# Patient Record
Sex: Female | Born: 1970 | Race: Black or African American | Hispanic: No | Marital: Married | Smoking: Never smoker
Health system: Southern US, Community
[De-identification: ages and names within clinical notes are randomized; demographics above are authoritative.]

## PROBLEM LIST (undated history)

## (undated) DIAGNOSIS — O165 Unspecified maternal hypertension, complicating the puerperium: Secondary | ICD-10-CM

## (undated) HISTORY — PX: NO PAST SURGERIES: SHX2092

## (undated) HISTORY — PX: INCISION AND DRAINAGE BREAST ABSCESS: SUR672

## (undated) HISTORY — DX: Unspecified maternal hypertension, complicating the puerperium: O16.5

---

## 2009-08-07 ENCOUNTER — Other Ambulatory Visit: Admission: RE | Admit: 2009-08-07 | Discharge: 2009-08-07 | Payer: Self-pay | Admitting: Obstetrics and Gynecology

## 2010-04-04 ENCOUNTER — Encounter
Admission: RE | Admit: 2010-04-04 | Discharge: 2010-04-04 | Payer: Self-pay | Source: Home / Self Care | Attending: Obstetrics and Gynecology | Admitting: Obstetrics and Gynecology

## 2010-09-28 ENCOUNTER — Other Ambulatory Visit (HOSPITAL_COMMUNITY)
Admission: RE | Admit: 2010-09-28 | Discharge: 2010-09-28 | Disposition: A | Discharge status: Home / Self Care | Payer: BC Managed Care – PPO | Source: Ambulatory Visit | Attending: Obstetrics and Gynecology | Admitting: Obstetrics and Gynecology

## 2010-09-28 DIAGNOSIS — Z01419 Encounter for gynecological examination (general) (routine) without abnormal findings: Secondary | ICD-10-CM | POA: Insufficient documentation

## 2011-03-18 ENCOUNTER — Other Ambulatory Visit: Payer: Self-pay | Admitting: Obstetrics and Gynecology

## 2011-03-18 DIAGNOSIS — Z1231 Encounter for screening mammogram for malignant neoplasm of breast: Secondary | ICD-10-CM

## 2011-04-11 ENCOUNTER — Ambulatory Visit
Admission: RE | Admit: 2011-04-11 | Discharge: 2011-04-11 | Disposition: A | Discharge status: Home / Self Care | Payer: BC Managed Care – PPO | Source: Ambulatory Visit | Attending: Obstetrics and Gynecology | Admitting: Obstetrics and Gynecology

## 2011-04-11 DIAGNOSIS — Z1231 Encounter for screening mammogram for malignant neoplasm of breast: Secondary | ICD-10-CM

## 2011-04-19 ENCOUNTER — Other Ambulatory Visit: Payer: Self-pay | Admitting: Obstetrics and Gynecology

## 2011-04-19 DIAGNOSIS — R928 Other abnormal and inconclusive findings on diagnostic imaging of breast: Secondary | ICD-10-CM

## 2011-04-30 ENCOUNTER — Ambulatory Visit
Admission: RE | Admit: 2011-04-30 | Discharge: 2011-04-30 | Disposition: A | Discharge status: Home / Self Care | Payer: BC Managed Care – PPO | Source: Ambulatory Visit | Attending: Obstetrics and Gynecology | Admitting: Obstetrics and Gynecology

## 2011-04-30 DIAGNOSIS — R928 Other abnormal and inconclusive findings on diagnostic imaging of breast: Secondary | ICD-10-CM

## 2011-09-19 LAB — OB RESULTS CONSOLE HEPATITIS B SURFACE ANTIGEN: Hepatitis B Surface Ag: NEGATIVE

## 2011-09-19 LAB — OB RESULTS CONSOLE ABO/RH: RH Type: POSITIVE

## 2011-09-19 LAB — OB RESULTS CONSOLE RUBELLA ANTIBODY, IGM: Rubella: IMMUNE

## 2011-09-19 LAB — OB RESULTS CONSOLE RPR: RPR: NONREACTIVE

## 2011-09-19 LAB — OB RESULTS CONSOLE HIV ANTIBODY (ROUTINE TESTING): HIV: NONREACTIVE

## 2011-10-16 ENCOUNTER — Other Ambulatory Visit (HOSPITAL_COMMUNITY)
Admission: RE | Admit: 2011-10-16 | Discharge: 2011-10-16 | Disposition: A | Discharge status: Home / Self Care | Payer: BC Managed Care – PPO | Source: Ambulatory Visit | Attending: Obstetrics and Gynecology | Admitting: Obstetrics and Gynecology

## 2011-10-16 DIAGNOSIS — Z113 Encounter for screening for infections with a predominantly sexual mode of transmission: Secondary | ICD-10-CM | POA: Insufficient documentation

## 2011-10-16 DIAGNOSIS — Z01419 Encounter for gynecological examination (general) (routine) without abnormal findings: Secondary | ICD-10-CM | POA: Insufficient documentation

## 2012-04-07 ENCOUNTER — Other Ambulatory Visit (HOSPITAL_COMMUNITY): Payer: Self-pay | Admitting: Obstetrics and Gynecology

## 2012-04-07 DIAGNOSIS — O9989 Other specified diseases and conditions complicating pregnancy, childbirth and the puerperium: Secondary | ICD-10-CM

## 2012-04-09 ENCOUNTER — Ambulatory Visit (HOSPITAL_COMMUNITY)
Admission: RE | Admit: 2012-04-09 | Discharge: 2012-04-09 | Disposition: A | Discharge status: Home / Self Care | Payer: BC Managed Care – PPO | Source: Ambulatory Visit | Attending: Obstetrics and Gynecology | Admitting: Obstetrics and Gynecology

## 2012-04-09 DIAGNOSIS — O9989 Other specified diseases and conditions complicating pregnancy, childbirth and the puerperium: Secondary | ICD-10-CM

## 2012-04-09 DIAGNOSIS — O09529 Supervision of elderly multigravida, unspecified trimester: Secondary | ICD-10-CM | POA: Insufficient documentation

## 2012-04-09 DIAGNOSIS — O3660X0 Maternal care for excessive fetal growth, unspecified trimester, not applicable or unspecified: Secondary | ICD-10-CM | POA: Insufficient documentation

## 2012-04-28 ENCOUNTER — Encounter (HOSPITAL_COMMUNITY): Payer: Self-pay | Admitting: *Deleted

## 2012-04-28 ENCOUNTER — Inpatient Hospital Stay (HOSPITAL_COMMUNITY)
Admission: AD | Admit: 2012-04-28 | Discharge: 2012-04-30 | DRG: 372 | Disposition: A | Discharge status: Home / Self Care | Payer: BC Managed Care – PPO | Source: Ambulatory Visit | Attending: Obstetrics and Gynecology | Admitting: Obstetrics and Gynecology

## 2012-04-28 ENCOUNTER — Encounter (HOSPITAL_COMMUNITY): Payer: Self-pay | Admitting: Anesthesiology

## 2012-04-28 ENCOUNTER — Inpatient Hospital Stay (HOSPITAL_COMMUNITY): Payer: BC Managed Care – PPO | Admitting: Anesthesiology

## 2012-04-28 DIAGNOSIS — O139 Gestational [pregnancy-induced] hypertension without significant proteinuria, unspecified trimester: Secondary | ICD-10-CM | POA: Diagnosis present

## 2012-04-28 DIAGNOSIS — O09529 Supervision of elderly multigravida, unspecified trimester: Secondary | ICD-10-CM | POA: Diagnosis present

## 2012-04-28 DIAGNOSIS — O99892 Other specified diseases and conditions complicating childbirth: Secondary | ICD-10-CM | POA: Diagnosis present

## 2012-04-28 DIAGNOSIS — Z22330 Carrier of Group B streptococcus: Secondary | ICD-10-CM

## 2012-04-28 LAB — CBC
HCT: 33.4 % — ABNORMAL LOW (ref 36.0–46.0)
HCT: 29.9 % — ABNORMAL LOW (ref 36.0–46.0)
Hemoglobin: 10 g/dL — ABNORMAL LOW (ref 12.0–15.0)
MCV: 84.3 fL (ref 78.0–100.0)
RBC: 3.96 MIL/uL (ref 3.87–5.11)
RBC: 3.54 MIL/uL — ABNORMAL LOW (ref 3.87–5.11)
RDW: 15.3 % (ref 11.5–15.5)
WBC: 8.1 10*3/uL (ref 4.0–10.5)
WBC: 13 10*3/uL — ABNORMAL HIGH (ref 4.0–10.5)

## 2012-04-28 LAB — TYPE AND SCREEN

## 2012-04-28 LAB — CREATININE, SERUM
GFR calc Af Amer: >90 mL/min (ref >90)
GFR calc non Af Amer: >90 mL/min (ref >90)

## 2012-04-28 LAB — ALT: ALT: 18 U/L (ref 0–35)

## 2012-04-28 LAB — URIC ACID: Uric Acid, Serum: 5 mg/dL (ref 2.4–7.0)

## 2012-04-28 LAB — AST: AST: 23 U/L (ref 0–37)

## 2012-04-28 MED ORDER — IBUPROFEN 600 MG PO TABS
600.0000 mg | ORAL_TABLET | Freq: Four times a day (QID) | ORAL | Status: DC
  Administered 2012-04-28 – 2012-04-30 (×9): 600 mg via ORAL
  Filled 2012-04-28 (×9): qty 1

## 2012-04-28 MED ORDER — ONDANSETRON HCL 4 MG PO TABS: 4.0000 mg | ORAL_TABLET | ORAL | Status: DC | PRN

## 2012-04-28 MED ORDER — EPHEDRINE 5 MG/ML INJ: 10.0000 mg | INTRAVENOUS | Status: DC | PRN

## 2012-04-28 MED ORDER — LACTATED RINGERS IV SOLN: 500.0000 mL | Freq: Once | INTRAVENOUS | Status: DC

## 2012-04-28 MED ORDER — FENTANYL 2.5 MCG/ML BUPIVACAINE 1/10 % EPIDURAL INFUSION (WH - ANES)
INTRAMUSCULAR | Status: DC | PRN
  Administered 2012-04-28: 14 mL/h via EPIDURAL

## 2012-04-28 MED ORDER — BENZOCAINE-MENTHOL 20-0.5 % EX AERO
1.0000 "application " | INHALATION_SPRAY | CUTANEOUS | Status: DC | PRN
  Administered 2012-04-30: 1 via TOPICAL
  Filled 2012-04-28 (×2): qty 56

## 2012-04-28 MED ORDER — DIPHENHYDRAMINE HCL 50 MG/ML IJ SOLN: 12.5000 mg | INTRAMUSCULAR | Status: DC | PRN

## 2012-04-28 MED ORDER — CITRIC ACID-SODIUM CITRATE 334-500 MG/5ML PO SOLN: 30.0000 mL | ORAL | Status: DC | PRN

## 2012-04-28 MED ORDER — DIBUCAINE 1 % RE OINT: 1.0000 "application " | TOPICAL_OINTMENT | RECTAL | Status: DC | PRN

## 2012-04-28 MED ORDER — DIPHENHYDRAMINE HCL 25 MG PO CAPS: 25.0000 mg | ORAL_CAPSULE | Freq: Four times a day (QID) | ORAL | Status: DC | PRN

## 2012-04-28 MED ORDER — PENICILLIN G POTASSIUM 5000000 UNITS IJ SOLR
5.0000 10*6.[IU] | Freq: Once | INTRAVENOUS | Status: AC
  Administered 2012-04-28: 5 10*6.[IU] via INTRAVENOUS
  Filled 2012-04-28: qty 5

## 2012-04-28 MED ORDER — WITCH HAZEL-GLYCERIN EX PADS: 1.0000 "application " | MEDICATED_PAD | CUTANEOUS | Status: DC | PRN

## 2012-04-28 MED ORDER — MEDROXYPROGESTERONE ACETATE 150 MG/ML IM SUSP: 150.0000 mg | INTRAMUSCULAR | Status: DC | PRN

## 2012-04-28 MED ORDER — ZOLPIDEM TARTRATE 5 MG PO TABS: 5.0000 mg | ORAL_TABLET | Freq: Every evening | ORAL | Status: DC | PRN

## 2012-04-28 MED ORDER — FENTANYL 2.5 MCG/ML BUPIVACAINE 1/10 % EPIDURAL INFUSION (WH - ANES)
14.0000 mL/h | INTRAMUSCULAR | Status: DC
  Filled 2012-04-28: qty 125

## 2012-04-28 MED ORDER — IBUPROFEN 600 MG PO TABS: 600.0000 mg | ORAL_TABLET | Freq: Four times a day (QID) | ORAL | Status: DC | PRN

## 2012-04-28 MED ORDER — SENNOSIDES-DOCUSATE SODIUM 8.6-50 MG PO TABS
2.0000 | ORAL_TABLET | Freq: Every day | ORAL | Status: DC
  Administered 2012-04-28 – 2012-04-29 (×2): 2 via ORAL

## 2012-04-28 MED ORDER — PHENYLEPHRINE 40 MCG/ML (10ML) SYRINGE FOR IV PUSH (FOR BLOOD PRESSURE SUPPORT): 80.0000 ug | PREFILLED_SYRINGE | INTRAVENOUS | Status: DC | PRN

## 2012-04-28 MED ORDER — LACTATED RINGERS IV SOLN
INTRAVENOUS | Status: DC
  Administered 2012-04-28: 01:00:00 via INTRAVENOUS

## 2012-04-28 MED ORDER — TETANUS-DIPHTH-ACELL PERTUSSIS 5-2.5-18.5 LF-MCG/0.5 IM SUSP: 0.5000 mL | Freq: Once | INTRAMUSCULAR | Status: DC

## 2012-04-28 MED ORDER — PENICILLIN G POTASSIUM 5000000 UNITS IJ SOLR
2.5000 10*6.[IU] | INTRAVENOUS | Status: DC
  Filled 2012-04-28 (×4): qty 2.5

## 2012-04-28 MED ORDER — PRENATAL MULTIVITAMIN CH
1.0000 | ORAL_TABLET | Freq: Every day | ORAL | Status: DC
  Administered 2012-04-28 – 2012-04-29 (×2): 1 via ORAL
  Filled 2012-04-28 (×3): qty 1

## 2012-04-28 MED ORDER — OXYTOCIN 40 UNITS IN LACTATED RINGERS INFUSION - SIMPLE MED
62.5000 mL/h | INTRAVENOUS | Status: DC
  Administered 2012-04-28: 62.5 mL/h via INTRAVENOUS
  Filled 2012-04-28: qty 1000

## 2012-04-28 MED ORDER — OXYTOCIN BOLUS FROM INFUSION
500.0000 mL | INTRAVENOUS | Status: DC
  Administered 2012-04-28: 500 mL via INTRAVENOUS

## 2012-04-28 MED ORDER — MEASLES, MUMPS & RUBELLA VAC ~~LOC~~ INJ
0.5000 mL | INJECTION | Freq: Once | SUBCUTANEOUS | Status: DC
  Filled 2012-04-28: qty 0.5

## 2012-04-28 MED ORDER — SIMETHICONE 80 MG PO CHEW: 80.0000 mg | CHEWABLE_TABLET | ORAL | Status: DC | PRN

## 2012-04-28 MED ORDER — PHENYLEPHRINE 40 MCG/ML (10ML) SYRINGE FOR IV PUSH (FOR BLOOD PRESSURE SUPPORT)
80.0000 ug | PREFILLED_SYRINGE | INTRAVENOUS | Status: DC | PRN
  Filled 2012-04-28: qty 5

## 2012-04-28 MED ORDER — LIDOCAINE HCL (PF) 1 % IJ SOLN
30.0000 mL | INTRAMUSCULAR | Status: DC | PRN
  Filled 2012-04-28: qty 30

## 2012-04-28 MED ORDER — LACTATED RINGERS IV SOLN: 500.0000 mL | INTRAVENOUS | Status: DC | PRN

## 2012-04-28 MED ORDER — LANOLIN HYDROUS EX OINT: TOPICAL_OINTMENT | CUTANEOUS | Status: DC | PRN

## 2012-04-28 MED ORDER — ONDANSETRON HCL 4 MG/2ML IJ SOLN: 4.0000 mg | Freq: Four times a day (QID) | INTRAMUSCULAR | Status: DC | PRN

## 2012-04-28 MED ORDER — OXYCODONE-ACETAMINOPHEN 5-325 MG PO TABS: 1.0000 | ORAL_TABLET | ORAL | Status: DC | PRN

## 2012-04-28 MED ORDER — ACETAMINOPHEN 325 MG PO TABS: 650.0000 mg | ORAL_TABLET | ORAL | Status: DC | PRN

## 2012-04-28 MED ORDER — ONDANSETRON HCL 4 MG/2ML IJ SOLN: 4.0000 mg | INTRAMUSCULAR | Status: DC | PRN

## 2012-04-28 MED ORDER — LIDOCAINE HCL (PF) 1 % IJ SOLN
INTRAMUSCULAR | Status: DC | PRN
  Administered 2012-04-28 (×2): 4 mL

## 2012-04-28 MED ORDER — EPHEDRINE 5 MG/ML INJ
10.0000 mg | INTRAVENOUS | Status: DC | PRN
  Filled 2012-04-28: qty 4

## 2012-04-28 NOTE — Anesthesia Preprocedure Evaluation (Signed)

## 2012-04-28 NOTE — Anesthesia Postprocedure Evaluation (Signed)
  Anesthesia Post-op Note  Patient: Dawn Solis  Procedure(s) Performed: * No procedures listed *  Patient Location: Mother/Baby  Anesthesia Type:Epidural  Level of Consciousness: awake, alert  and oriented  Airway and Oxygen Therapy: Patient Spontanous Breathing  Post-op Pain: mild  Post-op Assessment: Patient's Cardiovascular Status Stable, Respiratory Function Stable, No headache, No backache, No residual numbness and No residual motor weakness  Post-op Vital Signs: stable  Complications: No apparent anesthesia complications

## 2012-04-28 NOTE — Anesthesia Procedure Notes (Signed)
Epidural Patient location during procedure: OB Start time: 04/28/2012 2:24 AM  Staffing Anesthesiologist: Adaiah Morken A. Performed by: anesthesiologist   Preanesthetic Checklist Completed: patient identified, site marked, surgical consent, pre-op evaluation, timeout performed, IV checked, risks and benefits discussed and monitors and equipment checked  Epidural Patient position: sitting Prep: site prepped and draped and DuraPrep Patient monitoring: continuous pulse ox and blood pressure Approach: midline Injection technique: LOR air  Needle:  Needle type: Tuohy  Needle gauge: 17 G Needle length: 9 cm and 9 Needle insertion depth: 6 cm Catheter type: closed end flexible Catheter size: 19 Gauge Catheter at skin depth: 11 cm Test dose: negative and Other  Assessment Events: blood not aspirated, injection not painful, no injection resistance, negative IV test and no paresthesia  Additional Notes Patient identified. Risks and benefits discussed including failed block, incomplete  Pain control, post dural puncture headache, nerve damage, paralysis, blood pressure Changes, nausea, vomiting, reactions to medications-both toxic and allergic and post Partum back pain. All questions were answered. Patient expressed understanding and wished to proceed. Sterile technique was used throughout procedure. Epidural site was Dressed with sterile barrier dressing. No paresthesias, signs of intravascular injection Or signs of intrathecal spread were encountered.  Patient was more comfortable after the epidural was dosed. Please see RN's note for documentation of vital signs and FHR which are stable.

## 2012-04-28 NOTE — Progress Notes (Signed)
Post Partum Day 0 s/p vaginal delivery.  Subjective: no complaints, up ad lib, voiding and tolerating PO she denies headache visual disturbances or ruq pain  Objective: Blood pressure 141/78, pulse 99, temperature 97.8 F (36.6 C), temperature source Oral, resp. rate 18, height 5\' 6"  (1.676 m), weight 98.068 kg (216 lb 3.2 oz), SpO2 97.00%, unknown if currently breastfeeding.  Physical Exam:  General: alert and cooperative Lochia: appropriate Uterine Fundus: firm Incision: NA DVT Evaluation: No evidence of DVT seen on physical exam.   Basename 04/28/12 0110  HGB 11.0*  HCT 33.4*    Assessment/Plan: Circumcision prior to discharge' BP is mildly elevated will check HELLP labs .Marland Kitchen No signs or symptoms of preeclampsia on exam    LOS: 0 days   Lorrie Gargan J. 04/28/2012, 12:03 PM

## 2012-04-28 NOTE — H&P (Addendum)
Dawn Solis is a 42 y.o. female [redacted] weeks gestation, presenting for labor.  Denies rupture of membranes.   OB History    Grav Para Term Preterm Abortions TAB SAB Ect Mult Living   3 2 2       2      Past Medical History  Diagnosis Date  . No pertinent past medical history    Past Surgical History  Procedure Date  . No past surgeries    Family History: family history includes Arthritis in her mother; Cancer in her father; Diabetes in her father, paternal grandfather, and paternal grandmother; and Hypertension in her father, maternal grandfather, maternal grandmother, and mother. Social History:  reports that she has never smoked. She does not have any smokeless tobacco history on file. She reports that she does not drink alcohol or use illicit drugs. ROS noncontributory  Physical exam: General:  Well developed female.  No distress. HEENT normal Chest clear Heart S1 and S2 clear Abd term gravida Ext- nl Cx 4-5, C, -2 on admission Blood pressure 135/74, pulse 100, temperature 97.8 F (36.6 C), temperature source Oral, resp. rate 18, height 5\' 6"  (1.676 m), weight 98.068 kg (216 lb 3.2 oz).  ABO, Rh: O/--/-- (06/13 0000) Antibody: Negative (06/06 0000) Rubella:   RPR: Nonreactive (06/06 0000)  HBsAg: Negative (06/06 0000)  HIV: Non-reactive (06/06 0000)  GBS: Positive (12/18 0000)   Assessment/Plan: Term labor  Expectant care   Dawn Solis E 04/28/2012, 3:11 AM

## 2012-04-28 NOTE — Progress Notes (Signed)
Delivery Note At 3:29 AM a viable female was delivered via Vaginal, Spontaneous Delivery (Presentation: ; Occiput Anterior).  APGAR: , ; weight .   Placenta status: Intact, Spontaneous.  Cord:  with the following complications: None.   Anesthesia: Epidural  Episiotomy: None Lacerations: 1st degree Suture Repair: 2.0 chromic Est. Blood Loss (mL):   Mom to postpartum.  Baby to nursery-stable.  Charly Hunton E 04/28/2012, 3:46 AM

## 2012-04-28 NOTE — MAU Note (Signed)
Pt G3 P2 at 40.1wks having contractions every 3-31min.  GBS +.

## 2012-04-29 LAB — CBC
MCH: 28.2 pg (ref 26.0–34.0)
MCHC: 33 g/dL (ref 30.0–36.0)
MCV: 85.5 fL (ref 78.0–100.0)
Platelets: 201 10*3/uL (ref 150–400)

## 2012-04-29 NOTE — Progress Notes (Signed)
Post Partum Day 1 s/p vaginal delivery  Subjective: up ad lib, voiding and tolerating PO she denies headache blurred vision or ruq pain   Objective: Blood pressure 145/88, pulse 84, temperature 98 F (36.7 C), temperature source Oral, resp. rate 20, height 5\' 6"  (1.676 m), weight 98.068 kg (216 lb 3.2 oz), SpO2 96.00%, unknown if currently breastfeeding.  Physical Exam:  General: alert and cooperative Lochia: appropriate Uterine Fundus: firm Incision: NA DVT Evaluation: No evidence of DVT seen on physical exam.   Basename 04/29/12 0501 04/28/12 1340  HGB 9.5* 10.0*  HCT 28.8* 29.9*    Assessment/Plan: Plan for discharge tomorrow, Breastfeeding, Circumcision prior to discharge and Contraception will discuss further at postpartum visit  Pt with gestational hypertension.. Plan to have her come to the office next week for bp check.    LOS: 1 day   Dawn Solis J. 04/29/2012, 7:26 PM

## 2012-04-30 MED ORDER — IBUPROFEN 600 MG PO TABS: 600.0000 mg | ORAL_TABLET | Freq: Four times a day (QID) | ORAL | Status: DC

## 2012-04-30 MED ORDER — OXYCODONE-ACETAMINOPHEN 5-325 MG PO TABS: 1.0000 | ORAL_TABLET | ORAL | Status: DC | PRN

## 2012-04-30 NOTE — Progress Notes (Signed)
Post Partum Day 2 Subjective: no complaints  Denies headaches, visual changes.  Bleeding not excessive.  Objective: Blood pressure 130/75, pulse 73, temperature 98 F (36.7 C), temperature source Oral, resp. rate 18, height 5\' 6"  (1.676 m), weight 98.068 kg (216 lb 3.2 oz), SpO2 96.00%, unknown if currently breastfeeding.  Physical Exam:  General: alert, cooperative and no distress Lochia: not assessed at perineum Uterine Fundus: firm Incision: n/a DVT Evaluation: No evidence of DVT seen on physical exam.   Basename 04/29/12 0501 04/28/12 1340  HGB 9.5* 10.0*  HCT 28.8* 29.9*    Assessment/Plan: Discharge home GHTN.  F/u in 1 week.  Preeclampsia precautions given. PP depression precautions reviewed.   LOS: 2 days   Dawn Solis 04/30/2012, 8:22 AM

## 2012-04-30 NOTE — Discharge Summary (Signed)
Obstetric Discharge Summary Reason for Admission: onset of labor Prenatal Procedures: none Intrapartum Procedures: spontaneous vaginal delivery Postpartum Procedures: none Complications-Operative and Postpartum: 1st degree perineal laceration Hemoglobin  Date Value Range Status  04/29/2012 9.5* 12.0 - 15.0 g/dL Final     HCT  Date Value Range Status  04/29/2012 28.8* 36.0 - 46.0 % Final    Physical Exam:  Wnl.  See progress note day of discharge.  Discharge Diagnoses: Term Pregnancy-delivered  Discharge Information: Date: 04/30/2012 Activity: pelvic rest Diet: routine Medications: PNV, Ibuprofen, Iron and Percocet Condition: stable Instructions: See discharge instructions. Discharge to: home Follow-up Information    Follow up with Jessee Avers., MD. Schedule an appointment as soon as possible for a visit in 1 week. (BP check)    Contact information:   [address] [place] [address] [phone]       Follow up with Jessee Avers., MD. Schedule an appointment as soon as possible for a visit in 6 weeks. (Postpartum follow up.)    Contact information:   [address] [place] [address] [phone]          Newborn Data: Live born female  Birth Weight: 6 lb 8.4 oz (2960 g) APGAR: 9, 9  Home with mother.  Circ done by Dr. Richardson Dopp PP day #1.  Geryl Rankins 04/30/2012, 8:27 AM

## 2012-05-04 ENCOUNTER — Inpatient Hospital Stay (HOSPITAL_COMMUNITY): Admission: RE | Admit: 2012-05-04 | Payer: BC Managed Care – PPO | Source: Ambulatory Visit

## 2012-07-02 ENCOUNTER — Other Ambulatory Visit: Payer: Self-pay | Admitting: Obstetrics and Gynecology

## 2012-07-06 ENCOUNTER — Other Ambulatory Visit: Payer: Self-pay | Admitting: Obstetrics and Gynecology

## 2012-07-06 ENCOUNTER — Ambulatory Visit
Admission: RE | Admit: 2012-07-06 | Discharge: 2012-07-06 | Disposition: A | Discharge status: Home / Self Care | Payer: BC Managed Care – PPO | Source: Ambulatory Visit | Attending: Obstetrics and Gynecology | Admitting: Obstetrics and Gynecology

## 2012-07-06 DIAGNOSIS — N6310 Unspecified lump in the right breast, unspecified quadrant: Secondary | ICD-10-CM

## 2012-07-08 ENCOUNTER — Encounter (INDEPENDENT_AMBULATORY_CARE_PROVIDER_SITE_OTHER): Payer: BC Managed Care – PPO | Admitting: General Surgery

## 2012-07-09 ENCOUNTER — Encounter (INDEPENDENT_AMBULATORY_CARE_PROVIDER_SITE_OTHER): Payer: Self-pay | Admitting: General Surgery

## 2012-07-09 ENCOUNTER — Other Ambulatory Visit (INDEPENDENT_AMBULATORY_CARE_PROVIDER_SITE_OTHER): Payer: Self-pay | Admitting: General Surgery

## 2012-07-09 ENCOUNTER — Ambulatory Visit (INDEPENDENT_AMBULATORY_CARE_PROVIDER_SITE_OTHER): Payer: BC Managed Care – PPO | Admitting: General Surgery

## 2012-07-09 VITALS — BP 120/80 | HR 84 | Temp 98.6°F | Resp 16 | Ht 66.0 in | Wt 181.0 lb

## 2012-07-09 DIAGNOSIS — N611 Abscess of the breast and nipple: Secondary | ICD-10-CM | POA: Insufficient documentation

## 2012-07-09 DIAGNOSIS — N61 Mastitis without abscess: Secondary | ICD-10-CM

## 2012-07-09 NOTE — Patient Instructions (Signed)
Remove dressing tomorrow and shower then repack wound with gauze

## 2012-07-09 NOTE — Progress Notes (Signed)
Subjective:     Patient ID: Dawn Solis, female   DOB: Feb 14, 1971, 42 y.o.   MRN: [id number]  HPI We are asked to see the patient in consultation by Dr. Richardson Dopp to evaluate her for a right breast abscess. The patient is a 42 year old black female who recently had a new baby couple months ago. In February she started developing some swelling in the upper right breast. She had an ultrasound-guided aspiration of this fluid 3 days ago. She also just completed 10 days of Augmentin. Since stopping the Augmentin she has not had any fevers or chills. Her right breast pain has continued to improve.  Review of Systems  Constitutional: Negative.   HENT: Negative.   Eyes: Negative.   Respiratory: Negative.   Cardiovascular: Negative.   Gastrointestinal: Negative.   Endocrine: Negative.   Genitourinary: Negative.   Musculoskeletal: Negative.   Skin: Negative.   Allergic/Immunologic: Negative.   Neurological: Negative.   Hematological: Negative.   Psychiatric/Behavioral: Negative.        Objective:   Physical Exam  Constitutional: She is oriented to person, place, and time. She appears well-developed and well-nourished.  HENT:  Head: Normocephalic and atraumatic.  Eyes: Conjunctivae and EOM are normal. Pupils are equal, round, and reactive to light.  Neck: Normal range of motion. Neck supple.  Cardiovascular: Normal rate, regular rhythm and normal heart sounds.   Pulmonary/Chest: Effort normal and breath sounds normal.  There is a fluctuant area about 2-3 cm across in the 12:00 position of the right breast. There is some surrounding induration but no cellulitis.  Abdominal: Soft. Bowel sounds are normal.  Musculoskeletal: Normal range of motion.  Neurological: She is alert and oriented to person, place, and time.  Skin: Skin is warm and dry.  Psychiatric: She has a normal mood and affect. Her behavior is normal.       Assessment:     The patient appears to have a right breast abscess.      Plan:     The area of the right breast was prepped with ChloraPrep and infiltrated with 1% lidocaine with epinephrine. A small transverse incision was made overlying the fluctuant area with an 11 blade knife. We drained a moderate amount of old bloody fluid. There was no purulent material there. The wound was then packed with a 4 x 4 gauze and sterile dressings were applied. The patient tolerated the procedure well. We will plan to see her back in one week to check her progress. She was given instructions on how to do dressing changes.

## 2012-07-10 ENCOUNTER — Telehealth (INDEPENDENT_AMBULATORY_CARE_PROVIDER_SITE_OTHER): Payer: Self-pay | Admitting: General Surgery

## 2012-07-10 NOTE — Telephone Encounter (Signed)
Lorene Dy norco protocol. Please call in for patient.

## 2012-07-10 NOTE — Telephone Encounter (Signed)
Called in Rx Vicodin at the [facility] on [address] [phone] and spoke to [place] and patient is aware of the Rx being called in

## 2012-07-10 NOTE — Telephone Encounter (Signed)
Patient called and wants something stronger for pain, she was seen in the urgent office on 07-09-12 with breast abscess. Patient can be called back at home if there is any questions

## 2012-07-12 LAB — WOUND CULTURE: Gram Stain: NONE SEEN

## 2012-07-14 ENCOUNTER — Ambulatory Visit (INDEPENDENT_AMBULATORY_CARE_PROVIDER_SITE_OTHER): Payer: BC Managed Care – PPO | Admitting: Surgery

## 2012-07-14 ENCOUNTER — Encounter (INDEPENDENT_AMBULATORY_CARE_PROVIDER_SITE_OTHER): Payer: Self-pay | Admitting: Surgery

## 2012-07-14 ENCOUNTER — Telehealth (INDEPENDENT_AMBULATORY_CARE_PROVIDER_SITE_OTHER): Payer: Self-pay | Admitting: *Deleted

## 2012-07-14 VITALS — BP 132/82 | HR 83 | Temp 98.2°F | Resp 18 | Ht 66.0 in | Wt 185.6 lb

## 2012-07-14 DIAGNOSIS — Z9889 Other specified postprocedural states: Secondary | ICD-10-CM

## 2012-07-14 NOTE — Progress Notes (Signed)
Pt returns for wound check of right breast I and D.  Wound is clean and it was repacked.  Overall looks good and is healing well. Keep follow up appointment.

## 2012-07-14 NOTE — Telephone Encounter (Signed)
Patient called to state that the packing has changed from a bloody color to a yellow tinted cream color.  Some redness around the site.  Patient scheduled for urgent office today.

## 2012-07-14 NOTE — Patient Instructions (Signed)
Continue to pack.  Keep follow up appointment

## 2012-07-17 ENCOUNTER — Encounter (INDEPENDENT_AMBULATORY_CARE_PROVIDER_SITE_OTHER): Payer: BC Managed Care – PPO | Admitting: General Surgery

## 2012-07-20 ENCOUNTER — Ambulatory Visit (INDEPENDENT_AMBULATORY_CARE_PROVIDER_SITE_OTHER): Payer: BC Managed Care – PPO | Admitting: General Surgery

## 2012-07-20 ENCOUNTER — Encounter (INDEPENDENT_AMBULATORY_CARE_PROVIDER_SITE_OTHER): Payer: Self-pay | Admitting: General Surgery

## 2012-07-20 VITALS — BP 122/62 | HR 65 | Temp 98.0°F | Resp 18 | Ht 66.0 in | Wt 184.2 lb

## 2012-07-20 DIAGNOSIS — N61 Mastitis without abscess: Secondary | ICD-10-CM

## 2012-07-20 DIAGNOSIS — N611 Abscess of the breast and nipple: Secondary | ICD-10-CM

## 2012-07-20 NOTE — Progress Notes (Signed)
Subjective:     Patient ID: Dawn Solis, female   DOB: Mar 12, 1971, 42 y.o.   MRN: [id number]  HPI The patient is a 42 year old black female who is about one week status post incision and drainage of a right breast abscess. She has done well and has no complaints of pain today. She is having minimal drainage from the area. She denies any fevers or chills.  Review of Systems     Objective:   Physical Exam On exam the right breast wound is very small and shallow and very clean. It is almost too small to pack anymore. We did tack the edge of a 2 x 2 gauze into it    Assessment:     Status post incision and drainage of right breast abscess     Plan:     At this point had not really think the wound is large enough to pack anymore. She will continue to shower daily and cover the area with gauze until its completely healed. We will plan to see her back in 2 weeks to check her progress

## 2012-07-20 NOTE — Patient Instructions (Signed)
Keep area clean and covered until healed

## 2012-08-03 ENCOUNTER — Encounter (INDEPENDENT_AMBULATORY_CARE_PROVIDER_SITE_OTHER): Payer: BC Managed Care – PPO | Admitting: General Surgery

## 2012-08-04 ENCOUNTER — Ambulatory Visit (INDEPENDENT_AMBULATORY_CARE_PROVIDER_SITE_OTHER): Payer: BC Managed Care – PPO | Admitting: General Surgery

## 2012-08-04 ENCOUNTER — Encounter (INDEPENDENT_AMBULATORY_CARE_PROVIDER_SITE_OTHER): Payer: Self-pay | Admitting: General Surgery

## 2012-08-04 VITALS — BP 140/68 | HR 64 | Temp 97.6°F | Resp 16 | Ht 66.0 in | Wt 183.0 lb

## 2012-08-04 DIAGNOSIS — N61 Mastitis without abscess: Secondary | ICD-10-CM

## 2012-08-04 DIAGNOSIS — N611 Abscess of the breast and nipple: Secondary | ICD-10-CM

## 2012-08-04 NOTE — Progress Notes (Signed)
Subjective:     Patient ID: Dawn Solis, female   DOB: 18-Nov-1970, 42 y.o.   MRN: [id number]  HPI The patient is a 43 year old black female who is several weeks status post incision and drainage of a right breast abscess. She denies any breast pain. She feels as though it is completely healed now.  Review of Systems     Objective:   Physical Exam On exam the incision on the right breast is completely healed with no sign of infection.    Assessment:     The patient is status post incision and drainage of right breast abscess     Plan:     At this point she can return to all her normal activities without any restrictions. We will plan to see her back on a when necessary basis

## 2013-01-22 ENCOUNTER — Other Ambulatory Visit: Payer: Self-pay | Admitting: Obstetrics and Gynecology

## 2013-01-22 ENCOUNTER — Other Ambulatory Visit (HOSPITAL_COMMUNITY)
Admission: RE | Admit: 2013-01-22 | Discharge: 2013-01-22 | Disposition: A | Discharge status: Home / Self Care | Payer: BC Managed Care – PPO | Source: Ambulatory Visit | Attending: Obstetrics and Gynecology | Admitting: Obstetrics and Gynecology

## 2013-01-22 DIAGNOSIS — Z01419 Encounter for gynecological examination (general) (routine) without abnormal findings: Secondary | ICD-10-CM | POA: Insufficient documentation

## 2013-01-22 DIAGNOSIS — Z1151 Encounter for screening for human papillomavirus (HPV): Secondary | ICD-10-CM | POA: Insufficient documentation

## 2013-02-18 ENCOUNTER — Other Ambulatory Visit: Payer: Self-pay

## 2013-09-09 ENCOUNTER — Other Ambulatory Visit: Payer: Self-pay

## 2013-09-09 DIAGNOSIS — Z1231 Encounter for screening mammogram for malignant neoplasm of breast: Secondary | ICD-10-CM

## 2013-10-06 ENCOUNTER — Ambulatory Visit
Admission: RE | Admit: 2013-10-06 | Discharge: 2013-10-06 | Disposition: A | Discharge status: Home / Self Care | Payer: BC Managed Care – PPO | Source: Ambulatory Visit

## 2013-10-06 DIAGNOSIS — Z1231 Encounter for screening mammogram for malignant neoplasm of breast: Secondary | ICD-10-CM

## 2014-02-14 ENCOUNTER — Encounter (INDEPENDENT_AMBULATORY_CARE_PROVIDER_SITE_OTHER): Payer: Self-pay | Admitting: General Surgery

## 2019-01-29 DIAGNOSIS — Z13220 Encounter for screening for lipoid disorders: Secondary | ICD-10-CM | POA: Diagnosis not present

## 2019-01-29 DIAGNOSIS — Z0000 Encounter for general adult medical examination without abnormal findings: Secondary | ICD-10-CM | POA: Diagnosis not present

## 2019-02-02 ENCOUNTER — Other Ambulatory Visit: Payer: Self-pay | Admitting: Family Medicine

## 2019-02-02 DIAGNOSIS — Z1231 Encounter for screening mammogram for malignant neoplasm of breast: Secondary | ICD-10-CM

## 2019-03-03 DIAGNOSIS — D649 Anemia, unspecified: Secondary | ICD-10-CM | POA: Diagnosis not present

## 2019-03-03 DIAGNOSIS — Z0000 Encounter for general adult medical examination without abnormal findings: Secondary | ICD-10-CM | POA: Diagnosis not present

## 2019-03-03 DIAGNOSIS — Z1159 Encounter for screening for other viral diseases: Secondary | ICD-10-CM | POA: Diagnosis not present

## 2019-03-03 DIAGNOSIS — Z13220 Encounter for screening for lipoid disorders: Secondary | ICD-10-CM | POA: Diagnosis not present

## 2019-03-05 DIAGNOSIS — Z1211 Encounter for screening for malignant neoplasm of colon: Secondary | ICD-10-CM | POA: Diagnosis not present

## 2019-06-24 ENCOUNTER — Ambulatory Visit: Payer: Self-pay | Attending: Family

## 2019-06-24 DIAGNOSIS — Z23 Encounter for immunization: Secondary | ICD-10-CM

## 2019-06-24 NOTE — Progress Notes (Signed)
   Covid-19 Vaccination Clinic  Name:  Dawn Solis    MRN: [id number] DOB: 04-Nov-1970  06/24/2019  [name] was observed post Covid-19 immunization for 15 minutes without incident. She was provided with Vaccine Information Sheet and instruction to access the V-Safe system.   [name] was instructed to call 911 with any severe reactions post vaccine: Marland Kitchen Difficulty breathing  . Swelling of face and throat  . A fast heartbeat  . A bad rash all over body  . Dizziness and weakness   Immunizations Administered    Name Date Dose VIS Date Route   Moderna COVID-19 Vaccine 06/24/2019  4:05 PM 0.5 mL 03/16/2019 Intramuscular   Manufacturer: Moderna   Lot: [id number]   NDC: 74142-395-32

## 2019-07-27 ENCOUNTER — Ambulatory Visit: Payer: Self-pay | Attending: Family

## 2019-07-27 DIAGNOSIS — Z23 Encounter for immunization: Secondary | ICD-10-CM

## 2019-07-27 NOTE — Progress Notes (Signed)
   Covid-19 Vaccination Clinic  Name:  Dawn Solis    MRN: [id number] DOB: February 11, 1971  07/27/2019  Dawn Solis was observed post Covid-19 immunization for 15 minutes without incident. She was provided with Vaccine Information Sheet and instruction to access the V-Safe system.   Dawn Solis was instructed to call 911 with any severe reactions post vaccine: Marland Kitchen Difficulty breathing  . Swelling of face and throat  . A fast heartbeat  . A bad rash all over body  . Dizziness and weakness   Immunizations Administered    Name Date Dose VIS Date Route   Moderna COVID-19 Vaccine 07/27/2019  4:16 PM 0.5 mL 03/16/2019 Intramuscular   Manufacturer: Moderna   Lot: [id number]   NDC: 23300-762-26

## 2019-08-20 DIAGNOSIS — Z01419 Encounter for gynecological examination (general) (routine) without abnormal findings: Secondary | ICD-10-CM | POA: Diagnosis not present

## 2019-08-31 ENCOUNTER — Other Ambulatory Visit: Payer: Self-pay

## 2019-08-31 ENCOUNTER — Ambulatory Visit
Admission: RE | Admit: 2019-08-31 | Discharge: 2019-08-31 | Disposition: A | Discharge status: Home / Self Care | Payer: Self-pay | Source: Ambulatory Visit | Attending: Family Medicine | Admitting: Family Medicine

## 2019-08-31 DIAGNOSIS — Z1231 Encounter for screening mammogram for malignant neoplasm of breast: Secondary | ICD-10-CM

## 2019-12-10 ENCOUNTER — Emergency Department (HOSPITAL_COMMUNITY)
Admission: EM | Admit: 2019-12-10 | Discharge: 2019-12-10 | Disposition: A | Discharge status: Home / Self Care | Payer: BC Managed Care – PPO | Attending: Emergency Medicine | Admitting: Emergency Medicine

## 2019-12-10 ENCOUNTER — Emergency Department (HOSPITAL_COMMUNITY): Payer: BC Managed Care – PPO

## 2019-12-10 ENCOUNTER — Other Ambulatory Visit: Payer: Self-pay

## 2019-12-10 ENCOUNTER — Encounter (HOSPITAL_COMMUNITY): Payer: Self-pay

## 2019-12-10 DIAGNOSIS — R000 Tachycardia, unspecified: Secondary | ICD-10-CM | POA: Insufficient documentation

## 2019-12-10 DIAGNOSIS — Z5321 Procedure and treatment not carried out due to patient leaving prior to being seen by health care provider: Secondary | ICD-10-CM | POA: Insufficient documentation

## 2019-12-10 DIAGNOSIS — R110 Nausea: Secondary | ICD-10-CM | POA: Diagnosis not present

## 2019-12-10 DIAGNOSIS — R42 Dizziness and giddiness: Secondary | ICD-10-CM | POA: Diagnosis not present

## 2019-12-10 DIAGNOSIS — M79602 Pain in left arm: Secondary | ICD-10-CM | POA: Insufficient documentation

## 2019-12-10 LAB — BASIC METABOLIC PANEL
Anion gap: 13 (ref 5–15)
BUN: 15 mg/dL (ref 6–20)
CO2: 23 mmol/L (ref 22–32)
Calcium: 9.6 mg/dL (ref 8.9–10.3)
Chloride: 101 mmol/L (ref 98–111)
Creatinine, Ser: 1.07 mg/dL — ABNORMAL HIGH (ref 0.44–1.00)
GFR calc Af Amer: >60 mL/min (ref >60)
GFR calc non Af Amer: >60 mL/min (ref >60)
Glucose, Bld: 129 mg/dL — ABNORMAL HIGH (ref 70–99)
Potassium: 3.5 mmol/L (ref 3.5–5.1)
Sodium: 137 mmol/L (ref 135–145)

## 2019-12-10 LAB — TROPONIN I (HIGH SENSITIVITY)
Troponin I (High Sensitivity): 8 ng/L (ref <18)
Troponin I (High Sensitivity): 17 ng/L (ref <18)

## 2019-12-10 LAB — CBC
HCT: 36.1 % (ref 36.0–46.0)
Hemoglobin: 11.5 g/dL — ABNORMAL LOW (ref 12.0–15.0)
MCH: 27.4 pg (ref 26.0–34.0)
MCHC: 31.9 g/dL (ref 30.0–36.0)
MCV: 86.2 fL (ref 80.0–100.0)
Platelets: 275 10*3/uL (ref 150–400)
RBC: 4.19 MIL/uL (ref 3.87–5.11)
RDW: 12.6 % (ref 11.5–15.5)
WBC: 7.9 10*3/uL (ref 4.0–10.5)
nRBC: 0 % (ref 0.0–0.2)

## 2019-12-10 LAB — I-STAT BETA HCG BLOOD, ED (MC, WL, AP ONLY): I-stat hCG, quantitative: <5 m[IU]/mL (ref <5)

## 2019-12-10 NOTE — ED Notes (Signed)
Called patient for vital update patient didn't answer 

## 2019-12-10 NOTE — ED Triage Notes (Signed)
Pt states that she woke up tonight and felt like her heart was racing, nausea, pain in the L arm, some dizziness

## 2020-04-06 ENCOUNTER — Ambulatory Visit: Payer: BC Managed Care – PPO | Attending: Internal Medicine

## 2020-04-06 DIAGNOSIS — Z23 Encounter for immunization: Secondary | ICD-10-CM

## 2020-04-06 NOTE — Progress Notes (Signed)
   Covid-19 Vaccination Clinic  Name:  Dawn Solis    MRN: [id number] DOB: [birth year]  04/06/2020  Dawn Solis was observed post Covid-19 immunization for 15 minutes without incident. She was provided with Vaccine Information Sheet and instruction to access the V-Safe system.   Dawn Solis was instructed to call 911 with any severe reactions post vaccine: Marland Kitchen Difficulty breathing  . Swelling of face and throat  . A fast heartbeat  . A bad rash all over body  . Dizziness and weakness   Immunizations Administered    Name Date Dose VIS Date Route   Moderna Covid-19 Booster Vaccine 04/06/2020  4:33 PM 0.25 mL 02/02/2020 Intramuscular   Manufacturer: Gala Murdoch   Lot: [id number]   NDC: 88110-315-94

## 2021-02-02 ENCOUNTER — Other Ambulatory Visit: Payer: Self-pay | Admitting: Obstetrics and Gynecology

## 2021-02-02 DIAGNOSIS — Z1231 Encounter for screening mammogram for malignant neoplasm of breast: Secondary | ICD-10-CM

## 2021-03-12 ENCOUNTER — Ambulatory Visit
Admission: RE | Admit: 2021-03-12 | Discharge: 2021-03-12 | Disposition: A | Discharge status: Home / Self Care | Payer: BC Managed Care – PPO | Source: Ambulatory Visit

## 2021-03-12 DIAGNOSIS — Z1231 Encounter for screening mammogram for malignant neoplasm of breast: Secondary | ICD-10-CM

## 2022-02-15 ENCOUNTER — Other Ambulatory Visit: Payer: Self-pay | Admitting: Obstetrics and Gynecology

## 2022-02-15 DIAGNOSIS — Z1231 Encounter for screening mammogram for malignant neoplasm of breast: Secondary | ICD-10-CM

## 2022-04-18 ENCOUNTER — Ambulatory Visit
Admission: RE | Admit: 2022-04-18 | Discharge: 2022-04-18 | Disposition: A | Discharge status: Home / Self Care | Payer: BC Managed Care – PPO | Source: Ambulatory Visit | Attending: Obstetrics and Gynecology | Admitting: Obstetrics and Gynecology

## 2022-04-18 DIAGNOSIS — Z1231 Encounter for screening mammogram for malignant neoplasm of breast: Secondary | ICD-10-CM

## 2022-06-03 ENCOUNTER — Other Ambulatory Visit (HOSPITAL_COMMUNITY)
Admission: RE | Admit: 2022-06-03 | Discharge: 2022-06-03 | Disposition: A | Discharge status: Home / Self Care | Payer: 59 | Source: Ambulatory Visit | Attending: Obstetrics and Gynecology | Admitting: Obstetrics and Gynecology

## 2022-06-03 DIAGNOSIS — Z01419 Encounter for gynecological examination (general) (routine) without abnormal findings: Secondary | ICD-10-CM | POA: Diagnosis present

## 2022-06-05 LAB — CYTOLOGY - PAP
Comment: NEGATIVE
Diagnosis: REACTIVE
High risk HPV: NEGATIVE

## 2022-06-05 IMAGING — MG MM DIGITAL SCREENING BILAT W/ TOMO AND CAD
8 series · 8 of 24 positions shown · non-contrast
Comparison: Previous exam(s).

CLINICAL DATA: Screening.

EXAM:
DIGITAL SCREENING BILATERAL MAMMOGRAM WITH TOMOSYNTHESIS AND CAD
TECHNIQUE: Bilateral screening digital craniocaudal and mediolateral oblique
mammograms were obtained. Bilateral screening digital breast
tomosynthesis was performed. The images were evaluated with
computer-aided detection.

[R MLO synth-2D]
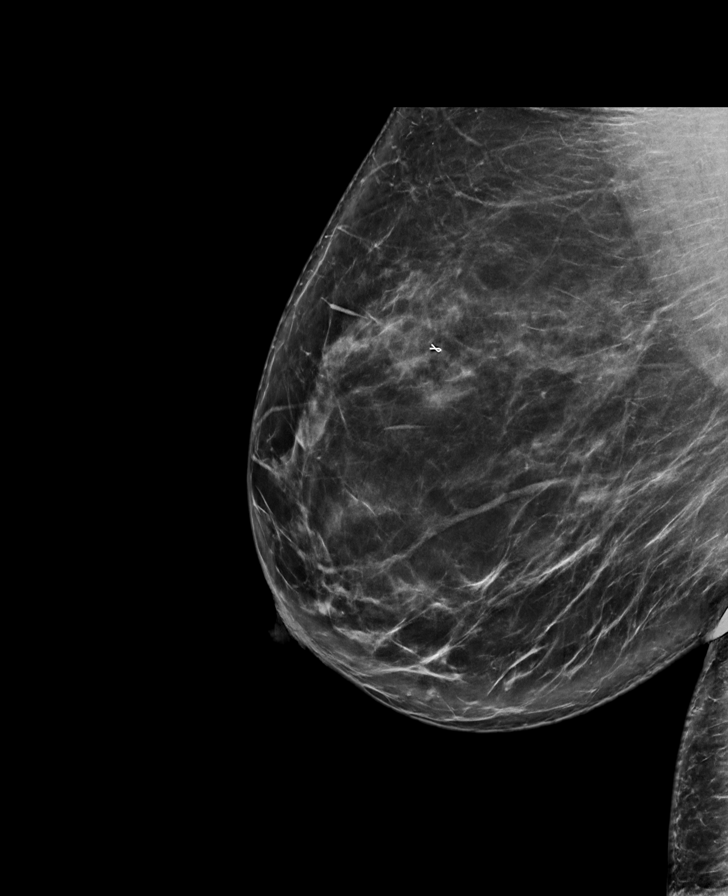

[L CC synth-2D]
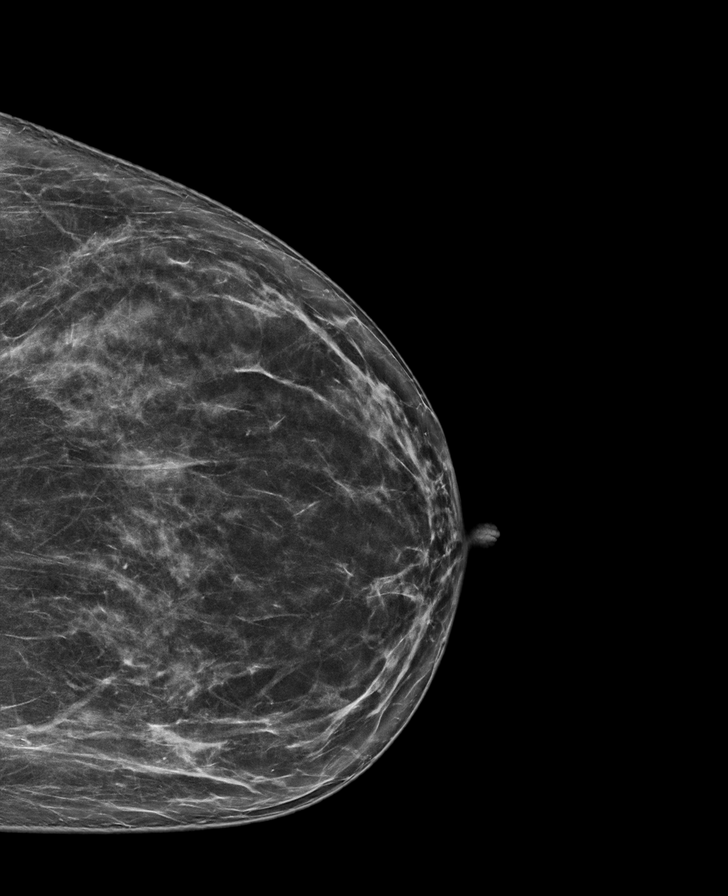

[L MLO synth-2D]
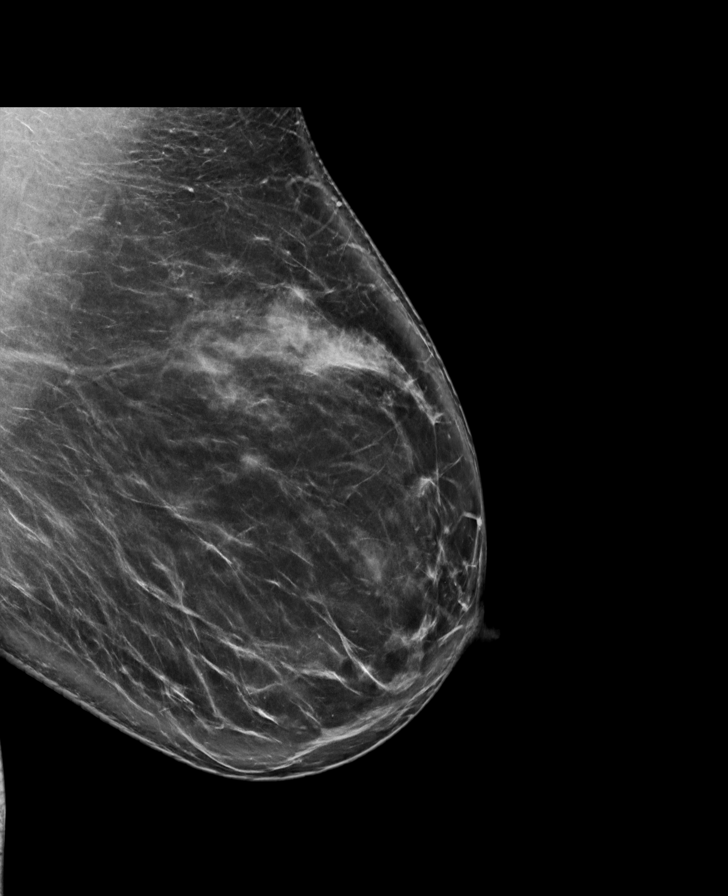

[R CC synth-2D]
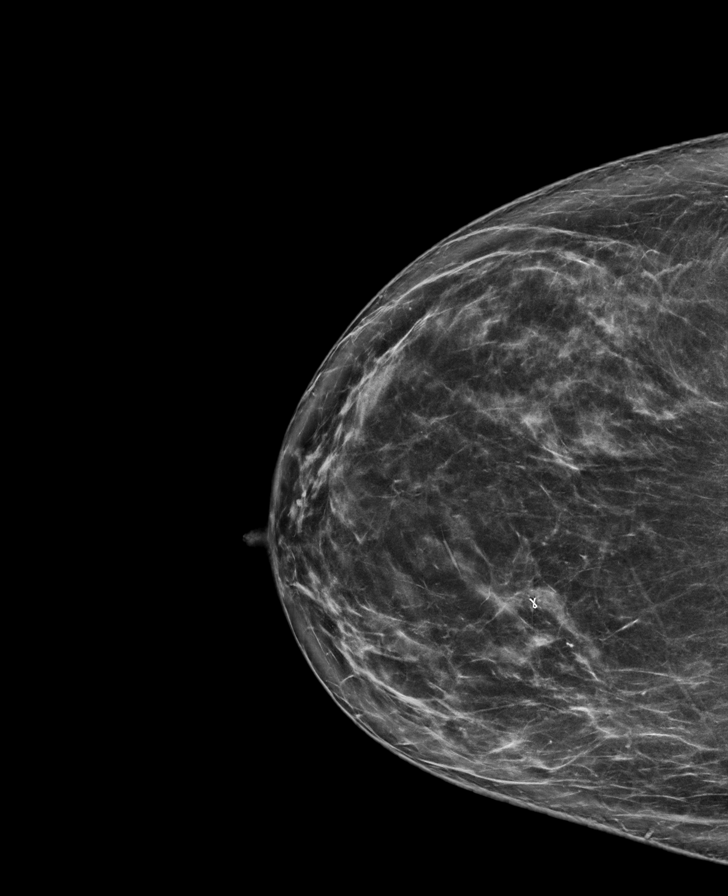

[R CC tomo · tomo slice 44/87.0]
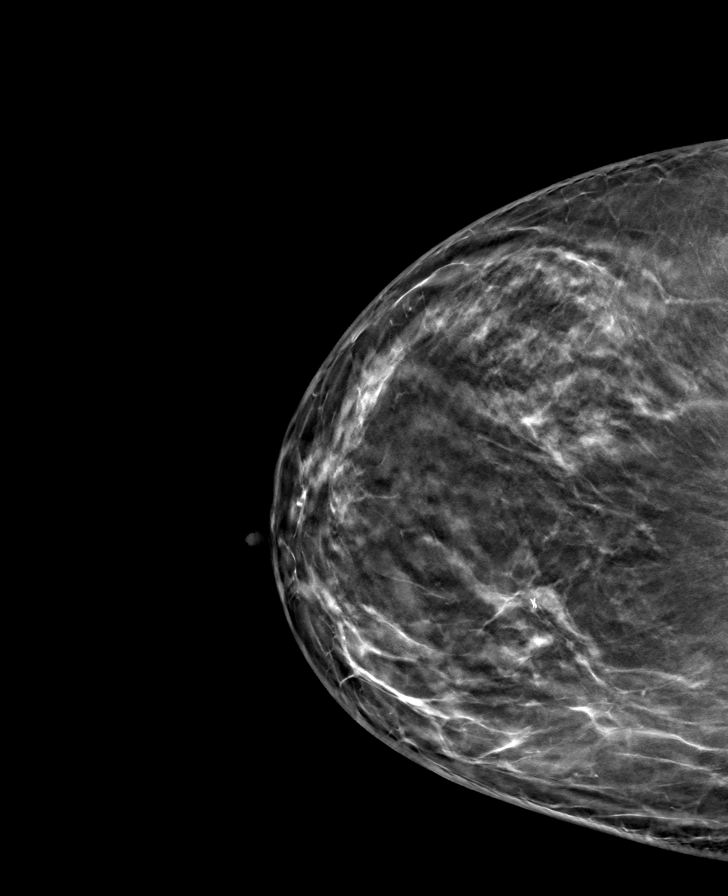

[L CC tomo · tomo slice 41/82.0]
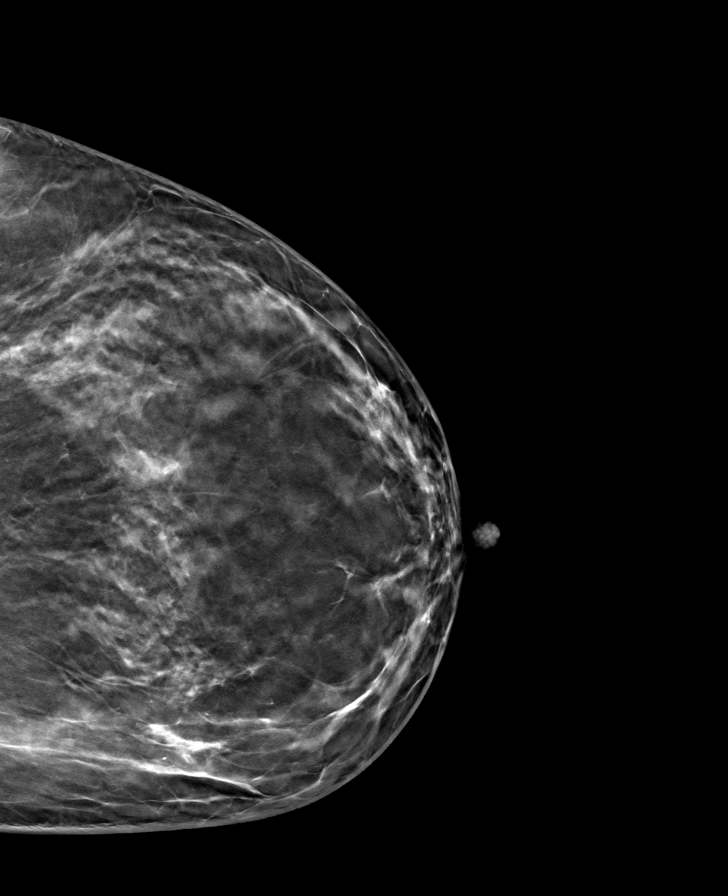

[R MLO tomo · tomo slice 50/99.0]
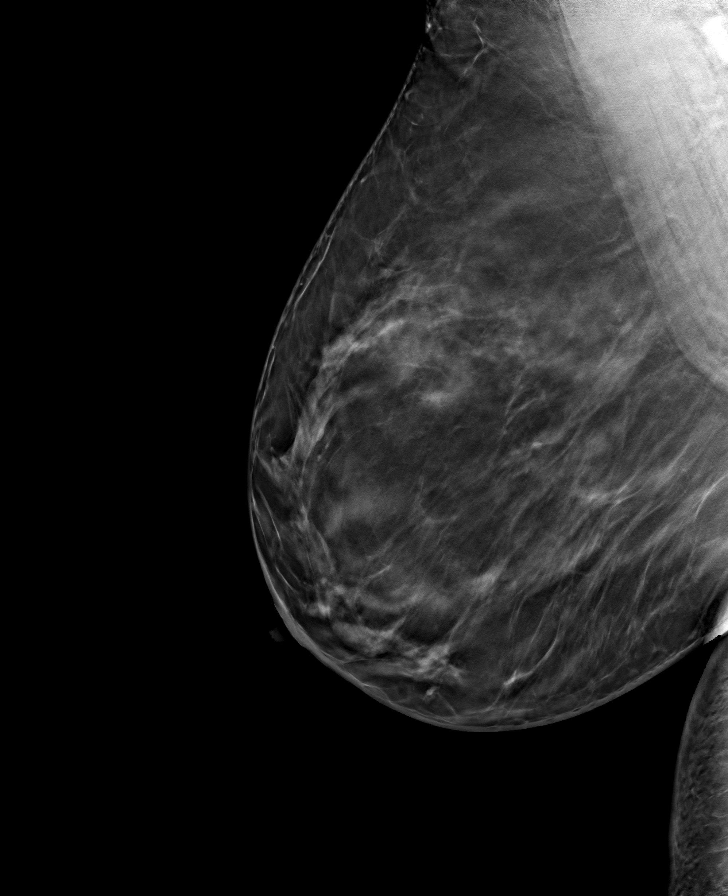

[L MLO tomo · tomo slice 52/103.0]
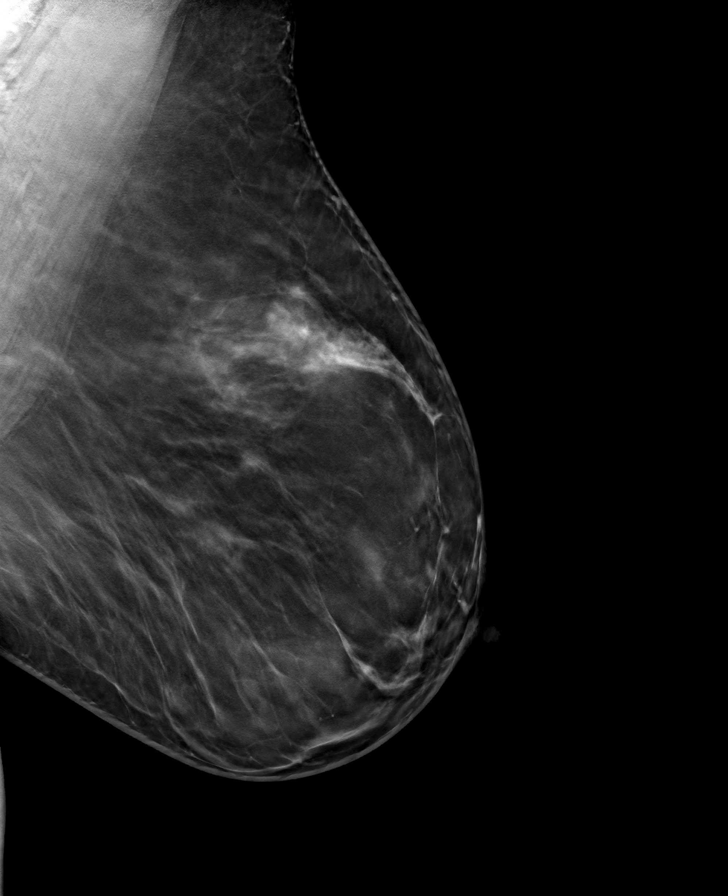

[8 of 24 positions shown; findings below may reference images not displayed]

ACR Breast Density Category b: There are scattered areas of
fibroglandular density.
FINDINGS: There are no findings suspicious for malignancy.
IMPRESSION: No mammographic evidence of malignancy. A result letter of this
screening mammogram will be mailed directly to the patient.

RECOMMENDATION:
Screening mammogram in one year. (Code:51-O-LD2)

BI-RADS CATEGORY  1: Negative.
